# Patient Record
Sex: Male | Born: 2000 | Race: White | Hispanic: No | Marital: Single | Smoking: Never smoker
Health system: Southern US, Community
[De-identification: ages and names within clinical notes are randomized; demographics above are authoritative.]

## PROBLEM LIST (undated history)

## (undated) DIAGNOSIS — F909 Attention-deficit hyperactivity disorder, unspecified type: Secondary | ICD-10-CM

## (undated) HISTORY — DX: Attention-deficit hyperactivity disorder, unspecified type: F90.9

---
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
## 2018-01-19 ENCOUNTER — Ambulatory Visit (HOSPITAL_COMMUNITY): Payer: BC Managed Care – PPO

## 2018-01-26 ENCOUNTER — Encounter (HOSPITAL_COMMUNITY): Payer: Self-pay | Admitting: Radiology

## 2018-02-17 ENCOUNTER — Telehealth (HOSPITAL_COMMUNITY): Payer: Self-pay | Admitting: Family Medicine

## 2018-02-17 NOTE — Telephone Encounter (Signed)
01/18/18 Patient cancelled echo due to insurance. 01/26/18 number has been disconnected or no longer inservice. Will send a letter EVD 02/08/18 [id number] # has been disconnected [facility]  He will be removed from the workqueue.

## 2018-04-26 ENCOUNTER — Encounter: Payer: Self-pay | Admitting: Neurology

## 2018-06-30 ENCOUNTER — Other Ambulatory Visit: Payer: Self-pay

## 2018-06-30 ENCOUNTER — Encounter: Payer: Self-pay | Admitting: Neurology

## 2018-06-30 ENCOUNTER — Ambulatory Visit: Payer: BC Managed Care – PPO | Admitting: Neurology

## 2018-06-30 ENCOUNTER — Encounter

## 2018-06-30 VITALS — BP 104/60 | HR 98 | Ht 72.0 in | Wt 136.0 lb

## 2018-06-30 DIAGNOSIS — R410 Disorientation, unspecified: Secondary | ICD-10-CM | POA: Diagnosis not present

## 2018-06-30 NOTE — Progress Notes (Signed)
NEUROLOGY CONSULTATION NOTE  Jeremy Noble MRN: [id number] DOB: Jul 29, 2001  Referring provider: Dr. Blair Heys Primary care provider: Dr. Blair Heys  Reason for consult:  Episodes of confusion  Dear Dr Manus Gunning:  Thank you for your kind referral of Jeremy Noble for consultation of the above symptoms. Although his history is well known to you, please allow me to reiterate it for the purpose of our medical record. The patient was accompanied to the clinic by his mother who also provides collateral information. Records and images were personally reviewed where available.  HISTORY OF PRESENT ILLNESS: This is a pleasant 17 year old right-handed man with a history of ADHD presenting for evaluation of transient episodes of confusion that started around May. He feels symptoms may have started prior to Ramadan (mid-May), but during Ramadan, he noticed recurrent daily episodes every 15 to 25 minutes where he would be talking to someone then he would not understand what they are saying or what is going on for 10 seconds. If he was talking, he knows what he is saying but cannot hear that he is saying that word. A few times he has asked someone to repeat themselves. His mother has not noticed any staring/unresponsive episodes. He feels that other people would not notice them. They would not affect driving. He initially thought the episodes were related to Vyvanse, he has been taking this for many years then stopped it briefly. When he restarted it, he noticed these episodes so he stopped the medication but continued to have them. He was fasting during Ramadan, but continued to have these symptoms past Ramadan until he noticed that they were not occurring anymore 2 weeks ago. They only occur in the daytime and do not wake him up from sleep. He denies any associated headache, dizziness, focal numbness/tingling/weakness, olfactory/gustatory hallucinations, rising epigastric sensation. He denies any gaps in  time. He denies any myoclonic jerks. His mother has noticed a few mild hand tremors, these do not affect writing or eating. He may have had some stress from school in April, but was on vacation from May until recently. He reports good sleep, no alcohol intake. His mother feels there has been some anxiety and has encouraged him to see a therapist which he declines. His sister has epilepsy. He had a normal birth and early development.  There is no history of febrile convulsions, CNS infections such as meningitis/encephalitis, significant traumatic brain injury, neurosurgical procedures. His father and paternal grandmother have tremors.   PAST MEDICAL HISTORY: Past Medical History:  Diagnosis Date  . ADHD     PAST SURGICAL HISTORY: History reviewed. No pertinent surgical history.  MEDICATIONS:  Outpatient Encounter Medications as of 06/30/2018  Medication Sig  . VYVANSE 20 MG capsule TK 1 C PO QD IN THE MORNING   No facility-administered encounter medications on file as of 06/30/2018.    ALLERGIES: No Known Allergies  FAMILY HISTORY: Family History  Problem Relation Age of Onset  . Seizures Sister     SOCIAL HISTORY: Social History   Socioeconomic History  . Marital status: Single    Spouse name: Not on file  . Number of children: Not on file  . Years of education: Not on file  . Highest education level: Not on file  Occupational History  . Not on file  Social Needs  . Financial resource strain: Not on file  . Food insecurity:    Worry: Not on file    Inability: Not on file  . Transportation  needs:    Medical: Not on file    Non-medical: Not on file  Tobacco Use  . Smoking status: Not on file  Substance and Sexual Activity  . Alcohol use: Not on file  . Drug use: Not on file  . Sexual activity: Not on file  Lifestyle  . Physical activity:    Days per week: Not on file    Minutes per session: Not on file  . Stress: Not on file  Relationships  . Social connections:     Talks on phone: Not on file    Gets together: Not on file    Attends religious service: Not on file    Active member of club or organization: Not on file    Attends meetings of clubs or organizations: Not on file    Relationship status: Not on file  . Intimate partner violence:    Fear of current or ex partner: Not on file    Emotionally abused: Not on file    Physically abused: Not on file    Forced sexual activity: Not on file  Other Topics Concern  . Not on file  Social History Narrative  . Not on file    REVIEW OF SYSTEMS: Constitutional: No fevers, chills, or sweats, no generalized fatigue, change in appetite Eyes: No visual changes, double vision, eye pain Ear, nose and throat: No hearing loss, ear pain, nasal congestion, sore throat Cardiovascular: No chest pain, palpitations Respiratory:  No shortness of breath at rest or with exertion, wheezes GastrointestinaI: No nausea, vomiting, diarrhea, abdominal pain, fecal incontinence Genitourinary:  No dysuria, urinary retention or frequency Musculoskeletal:  No neck pain, back pain Integumentary: No rash, pruritus, skin lesions Neurological: as above Psychiatric: No depression, insomnia, anxiety Endocrine: No palpitations, fatigue, diaphoresis, mood swings, change in appetite, change in weight, increased thirst Hematologic/Lymphatic:  No anemia, purpura, petechiae. Allergic/Immunologic: no itchy/runny eyes, nasal congestion, recent allergic reactions, rashes  PHYSICAL EXAM: Vitals:   06/30/18 0905  BP: 104/60  Pulse: 98  SpO2: 98%   General: No acute distress Head:  Normocephalic/atraumatic Eyes: Fundoscopic exam shows bilateral sharp discs, no vessel changes, exudates, or hemorrhages Neck: supple, no paraspinal tenderness, full range of motion Back: No paraspinal tenderness Heart: regular rate and rhythm Lungs: Clear to auscultation bilaterally. Vascular: No carotid bruits. Skin/Extremities: No rash, no  edema Neurological Exam: Mental status: alert and oriented to person, place, and time, no dysarthria or aphasia, Fund of knowledge is appropriate.  Recent and remote memory are intact. 3/3 delayed recall.  Attention and concentration are normal.    Able to name objects and repeat phrases. Cranial nerves: CN I: not tested CN II: pupils equal, round and reactive to light, visual fields intact, fundi unremarkable. CN III, IV, VI:  full range of motion, no nystagmus, no ptosis CN V: facial sensation intact CN VII: upper and lower face symmetric CN VIII: hearing intact to finger rub CN IX, X: gag intact, uvula midline CN XI: sternocleidomastoid and trapezius muscles intact CN XII: tongue midline Bulk & Tone: normal, no fasciculations. Motor: 5/5 throughout with no pronator drift. Sensation: intact to light touch, cold, pin, vibration and joint position sense.  No extinction to double simultaneous stimulation.  Romberg test negative Deep Tendon Reflexes: +2 throughout, no ankle clonus Plantar responses: downgoing bilaterally Cerebellar: no incoordination on finger to nose, heel to shin. No dysdiadochokinesia Gait: narrow-based and steady, able to tandem walk adequately. Tremor: none  IMPRESSION: This is a pleasant 17 year old  right-handed man with a history of ADHD, presenting for evaluation of a 3 month history of new onset recurrent episodes of transient confusion that were occurring multiple times a day. He has not noticed them in the past 2 weeks. Etiology of symptoms is unclear, his sister has primary generalized epilepsy, seizures are a consideration. We also discussed complicated migraines (although he denies headaches), anxiety, can potentially cause similar symptoms. MRI brain with and without contrast and a 1-hour sleep-deprived EEG will be ordered to assess for focal abnormalities that increase risk for recurrent seizures. We discussed that if tests are normal and symptoms recur, a  48-hour EEG will be done to classify his symptoms. His mother feels he is anxious, he was encouraged to see a school counselor. [place] driving laws were discussed with the patient, and he knows to stop driving after an episode of loss of awareness/consciousness, until 6 months event-free. He will follow-up in 4 months and knows to call for any changes.   Thank you for allowing me to participate in the care of this patient. Please do not hesitate to call for any questions or concerns.   Patrcia Dolly, M.D.  CC: Dr. Manus Gunning

## 2018-06-30 NOTE — Patient Instructions (Addendum)
1. Schedule MRI brain with and without contrast  We have sent a referral to [facility] for your MRI and they will call you directly to schedule your appt. They are located at [address] [facility]. If you need to contact them directly please call [phone].   2. Schedule 1-hour sleep-deprived EEG 3. Our office will call you with results, if normal, continue to monitor symptoms and call if any changes. If symptoms recur, we will plan to do a 48-hour EEG 4. Follow-up in 3-4 months, call for any changes

## 2018-07-06 ENCOUNTER — Telehealth: Payer: Self-pay

## 2018-07-06 ENCOUNTER — Ambulatory Visit (INDEPENDENT_AMBULATORY_CARE_PROVIDER_SITE_OTHER): Payer: BC Managed Care – PPO | Admitting: Neurology

## 2018-07-06 DIAGNOSIS — R410 Disorientation, unspecified: Secondary | ICD-10-CM | POA: Diagnosis not present

## 2018-07-06 NOTE — Procedures (Signed)
ELECTROENCEPHALOGRAM REPORT  Date of Study: 07/06/2018  Patient's Name: Jeremy Noble MRN: [id number] Date of Birth: [birth year]  Referring Provider: Dr. Patrcia Dolly  Clinical History: This is a 17 year old man with a 50-month history of recurrent episodes of transient confusion occurring multiple times a day  Medications: Vyvanse  Technical Summary: A multichannel digital 1-hour sleep-deprived EEG recording measured by the international 10-20 system with electrodes applied with paste and impedances below 5000 ohms performed in our laboratory with EKG monitoring in an awake and asleep patient.  Hyperventilation and photic stimulation were performed.  The digital EEG was referentially recorded, reformatted, and digitally filtered in a variety of bipolar and referential montages for optimal display.    Description: The patient is awake and asleep during the recording.  During maximal wakefulness, there is a symmetric, medium voltage 10 Hz posterior dominant rhythm that attenuates with eye opening.  The record is symmetric.  During drowsiness and sleep, there is an increase in theta slowing of the background.  Vertex waves and symmetric sleep spindles were seen.  Hyperventilation and photic stimulation did not elicit any abnormalities.  There were no epileptiform discharges or electrographic seizures seen.    EKG lead was unremarkable.  Impression: This 1-hour awake and asleep EEG is normal.    Clinical Correlation: A normal EEG does not exclude a clinical diagnosis of epilepsy.  If further clinical questions remain, prolonged EEG may be helpful.  Clinical correlation is advised.   Patrcia Dolly, M.D.

## 2018-07-06 NOTE — Telephone Encounter (Signed)
-----   Message from Van Clines, MD sent at 07/06/2018  2:00 PM EDT ----- Pls let him/mother know the EEG was normal, proceed with MRI brain. If symptoms recur, we will plan to do a 48-hour EEG. Thanks

## 2018-07-06 NOTE — Telephone Encounter (Signed)
Call pt.  No answer.  Phone rang 10 times and disconnected.  Unable to relay message below.

## 2018-10-13 ENCOUNTER — Encounter: Payer: Self-pay | Admitting: Neurology

## 2018-11-02 ENCOUNTER — Other Ambulatory Visit: Payer: Self-pay

## 2018-11-02 ENCOUNTER — Encounter

## 2018-11-02 ENCOUNTER — Ambulatory Visit: Payer: BC Managed Care – PPO | Admitting: Neurology

## 2018-11-02 ENCOUNTER — Encounter: Payer: Self-pay | Admitting: Neurology

## 2018-11-02 VITALS — BP 100/66 | HR 92 | Ht 72.0 in | Wt 131.0 lb

## 2018-11-02 DIAGNOSIS — R410 Disorientation, unspecified: Secondary | ICD-10-CM

## 2018-11-02 NOTE — Patient Instructions (Addendum)
1. Proceed with MRI brain with and without contrast 2. Schedule 48-hour EEG 3. Follow-up after tests, call for any changes  We have sent a referral to [facility] for your MRI and they will call you directly to schedule your appt. They are located at [address] [facility]. If you need to contact them directly please call [phone].

## 2018-11-02 NOTE — Progress Notes (Signed)
NEUROLOGY FOLLOW UP OFFICE NOTE  Jeremy Noble [id number] [birth year]  HISTORY OF PRESENT ILLNESS: I had the pleasure of seeing Jeremy Noble in follow-up in the neurology clinic on 11/02/2018.  The patient was last seen 4 months ago for recurrent episodes of transient confusion. He is accompanied by his father who helps supplement the history today.  Records and images were personally reviewed where available.  His 1-hour EEG was normal. He has not done MRI yet. Since his last visit, he reports that he had no symptoms while he was in school from September to mid-December while he was on Christmas vacation. Since then, he would have them a few times a day again on a daily basis, he feels they are not as often and not as strong, but with similar symptoms where he feels he would not understand what people are saying or what is going on for just 5 seconds. Family would not notice anything out of the ordinary, no staring/unresponsive episodes. He denies any other associated symptoms, no headache, dizziness, vision changes, nausea, olfactory/gustatory hallucinations, focal numbness/tingling/weakness, myoclonic jerks. He did not take Vyvanse for 3 days and did not notice any difference in symptoms. He reports memory and grades are good.   History on Initial Assessment 06/30/2018: This is a pleasant 18 year old right-handed man with a history of ADHD presenting for evaluation of transient episodes of confusion that started around May. He feels symptoms may have started prior to Ramadan (mid-May), but during Ramadan, he noticed recurrent daily episodes every 15 to 25 minutes where he would be talking to someone then he would not understand what they are saying or what is going on for 10 seconds. If he was talking, he knows what he is saying but cannot hear that he is saying that word. A few times he has asked someone to repeat themselves. His mother has not noticed any staring/unresponsive episodes. He feels that other  people would not notice them. They would not affect driving. He initially thought the episodes were related to Vyvanse, he has been taking this for many years then stopped it briefly. When he restarted it, he noticed these episodes so he stopped the medication but continued to have them. He was fasting during Ramadan, but continued to have these symptoms past Ramadan until he noticed that they were not occurring anymore 2 weeks ago. They only occur in the daytime and do not wake him up from sleep. He denies any associated headache, dizziness, focal numbness/tingling/weakness, olfactory/gustatory hallucinations, rising epigastric sensation. He denies any gaps in time. He denies any myoclonic jerks. His mother has noticed a few mild hand tremors, these do not affect writing or eating. He may have had some stress from school in April, but was on vacation from May until recently. He reports good sleep, no alcohol intake. His mother feels there has been some anxiety and has encouraged him to see a therapist which he declines. His sister has epilepsy. He had a normal birth and early development.  There is no history of febrile convulsions, CNS infections such as meningitis/encephalitis, significant traumatic brain injury, neurosurgical procedures. His father and paternal grandmother have tremors.   PAST MEDICAL HISTORY: Past Medical History:  Diagnosis Date  . ADHD     MEDICATIONS: Current Outpatient Medications on File Prior to Visit  Medication Sig Dispense Refill  . VYVANSE 20 MG capsule TK 1 C PO QD IN THE MORNING  0   No current facility-administered medications on file prior to  visit.     ALLERGIES: No Known Allergies  FAMILY HISTORY: Family History  Problem Relation Age of Onset  . Seizures Sister     SOCIAL HISTORY: Social History   Socioeconomic History  . Marital status: Single    Spouse name: Not on file  . Number of children: Not on file  . Years of education: Not on file  .  Highest education level: Not on file  Occupational History  . Not on file  Social Needs  . Financial resource strain: Not on file  . Food insecurity:    Worry: Not on file    Inability: Not on file  . Transportation needs:    Medical: Not on file    Non-medical: Not on file  Tobacco Use  . Smoking status: Never Smoker  . Smokeless tobacco: Never Used  Substance and Sexual Activity  . Alcohol use: Never    Frequency: Never  . Drug use: Never  . Sexual activity: Never  Lifestyle  . Physical activity:    Days per week: Not on file    Minutes per session: Not on file  . Stress: Not on file  Relationships  . Social connections:    Talks on phone: Not on file    Gets together: Not on file    Attends religious service: Not on file    Active member of club or organization: Not on file    Attends meetings of clubs or organizations: Not on file    Relationship status: Not on file  . Intimate partner violence:    Fear of current or ex partner: Not on file    Emotionally abused: Not on file    Physically abused: Not on file    Forced sexual activity: Not on file  Other Topics Concern  . Not on file  Social History Narrative   Pt lives in 2 story home with his parents and sister   Currently in his second year at [employer]   Works part-time as Licensed conveyancergrocery clerk for [employer]    REVIEW OF SYSTEMS: Constitutional: No fevers, chills, or sweats, no generalized fatigue, change in appetite Eyes: No visual changes, double vision, eye pain Ear, nose and throat: No hearing loss, ear pain, nasal congestion, sore throat Cardiovascular: No chest pain, palpitations Respiratory:  No shortness of breath at rest or with exertion, wheezes GastrointestinaI: No nausea, vomiting, diarrhea, abdominal pain, fecal incontinence Genitourinary:  No dysuria, urinary retention or frequency Musculoskeletal:  No neck pain, back pain Integumentary: No rash, pruritus, skin lesions Neurological: as  above Psychiatric: No depression, insomnia, anxiety Endocrine: No palpitations, fatigue, diaphoresis, mood swings, change in appetite, change in weight, increased thirst Hematologic/Lymphatic:  No anemia, purpura, petechiae. Allergic/Immunologic: no itchy/runny eyes, nasal congestion, recent allergic reactions, rashes  PHYSICAL EXAM: Vitals:   11/02/18 0943  BP: 100/66  Pulse: 92  SpO2: 98%   General: No acute distress Head:  Normocephalic/atraumatic Neck: supple, no paraspinal tenderness, full range of motion Heart:  Regular rate and rhythm Lungs:  Clear to auscultation bilaterally Back: No paraspinal tenderness Skin/Extremities: No rash, no edema Neurological Exam: alert and oriented to person, place, and time. No aphasia or dysarthria. Fund of knowledge is appropriate.  Recent and remote memory are intact.  Attention and concentration are normal.    Able to name objects and repeat phrases. Cranial nerves: Pupils equal, round, reactive to light. Extraocular movements intact with no nystagmus. Visual fields full. Facial sensation intact. No facial asymmetry. Tongue, uvula, palate  midline.  Motor: Bulk and tone normal, muscle strength 5/5 throughout with no pronator drift.  Sensation to light touch, temperature and vibration intact.  No extinction to double simultaneous stimulation. Finger to nose testing intact.  Gait narrow-based and steady, able to tandem walk adequately.  Romberg negative.  IMPRESSION: This is a pleasant 18 yo RH man with a history of ADHD, with recurrent episodes of transient confusion occurring multiple times a day. Symptoms quieted down for 3 months, then recurred mid-December. His 1-hour EEG was normal, however typical events were not captured. Etiology of symptoms remains unclear, seizures is considered, his sister has seizures, however there may be a psychological component as well. Proceed with planned MRI brain with and without contrast to assess for underlying  structural abnormality. A 48-hour EEG will be done to further classify his symptoms. He is aware of [place] driving laws to stop driving after an episode of loss of awareness/consciousness, until 6 months event-free. He will follow-up after the tests, they know to call for any changes.   Thank you for allowing me to participate in his care.  Please do not hesitate to call for any questions or concerns.  The duration of this appointment visit was 20 minutes of face-to-face time with the patient.  Greater than 50% of this time was spent in counseling, explanation of diagnosis, planning of further management, and coordination of care.   Patrcia DollyKaren Sonya Pucci, M.D.   CC: Dr. Manus GunningEhinger

## 2018-11-03 ENCOUNTER — Telehealth: Payer: Self-pay | Admitting: *Deleted

## 2018-11-03 NOTE — Telephone Encounter (Signed)
LMOM to call [phone] to set up 48 hr ambulatory EEG. We do these on Monday mornings and remove them on Wednesday mornings.

## 2018-11-07 ENCOUNTER — Telehealth: Payer: Self-pay | Admitting: Neurology

## 2018-11-07 NOTE — Telephone Encounter (Signed)
Patient is calling in to let yall know he is going to get the MRI done first and wait for the results before he scheduled his EEG. Just FYI. Thanks!

## 2018-11-15 ENCOUNTER — Ambulatory Visit
Admission: RE | Admit: 2018-11-15 | Discharge: 2018-11-15 | Disposition: A | Discharge status: Home / Self Care | Payer: BC Managed Care – PPO | Source: Ambulatory Visit | Attending: Neurology | Admitting: Neurology

## 2018-11-15 DIAGNOSIS — R410 Disorientation, unspecified: Secondary | ICD-10-CM

## 2018-11-15 MED ORDER — GADOBENATE DIMEGLUMINE 529 MG/ML IV SOLN
12.0000 mL | Freq: Once | INTRAVENOUS | Status: AC | PRN
  Administered 2018-11-15: 12 mL via INTRAVENOUS

## 2018-11-22 ENCOUNTER — Telehealth: Payer: Self-pay

## 2018-11-22 NOTE — Telephone Encounter (Signed)
Called pt.  No answer.  LMOM asking for return call to relay results below.  

## 2018-11-22 NOTE — Telephone Encounter (Signed)
-----   Message from Van Clines, MD sent at 11/16/2018  9:03 AM EST ----- Pls let him know the MRI brain was normal, no evidence of tumor, stroke, or bleed. He was waiting for MRI results before proceeding with ambulatory EEG, would proceed with the test, pls let Darl Pikes know to schedule. Thanks

## 2018-12-16 ENCOUNTER — Telehealth: Payer: Self-pay | Admitting: Neurology

## 2018-12-16 NOTE — Telephone Encounter (Signed)
Patient called regarding his results. Please Call. Thanks

## 2018-12-16 NOTE — Telephone Encounter (Signed)
Returned call to pt.  Relayed normal MRI results as well as Dr. Rosalyn Gess recommendation that pt have Ambulatory EEG done.  Pt states that he would like some time to think about proceeding with EEG and states that he will call our office when he is ready to schedule.  I did advise that neither the front desk nor myself schedule the ambulatory EEG's but that he would need to leave message with the front desk for Darl Pikes to schedule.

## 2018-12-19 ENCOUNTER — Telehealth: Payer: Self-pay | Admitting: *Deleted

## 2018-12-19 NOTE — Telephone Encounter (Signed)
Returned call from message he left on Friday the 21st at 4:32 pm stating he wants to schedule the ambulatory EEG.  I offered the first available on March 9th he said he wants to think about it. I informed him of other dates and times so when he calls with his calendar in hand he will have an idea of when there may be availability. I told him first come first serve so those dates have no guarantees. He wanted 12/26/2018 but it is full so he said he will think about it and decide if he wants to proceed or not.

## 2018-12-23 NOTE — Telephone Encounter (Signed)
I called and left . He returned the call and is set up for that date.a message that March 2nd is now available

## 2018-12-26 ENCOUNTER — Ambulatory Visit (INDEPENDENT_AMBULATORY_CARE_PROVIDER_SITE_OTHER): Payer: BC Managed Care – PPO | Admitting: Neurology

## 2018-12-26 DIAGNOSIS — R410 Disorientation, unspecified: Secondary | ICD-10-CM | POA: Diagnosis not present

## 2019-01-16 NOTE — Procedures (Signed)
ELECTROENCEPHALOGRAM REPORT  Dates of Recording: 12/26/2018 8:08AM to 12/28/2018 8:12AM  Patient's Name: Jeremy Noble MRN: [id number] Date of Birth: 2001-03-13  Referring Provider: Dr. Patrcia Dolly  Procedure: 48-hour ambulatory EEG  History: This is a 18 year old man with recurrent episodes of transient confusion occurring multiple times a day.  Medications: Vyvanse  Technical Summary: This is a 48-hour multichannel digital video EEG recording measured by the international 10-20 system with electrodes applied with paste and impedances below 5000 ohms performed as portable with EKG monitoring.  The digital EEG was referentially recorded, reformatted, and digitally filtered in a variety of bipolar and referential montages for optimal display.    DESCRIPTION OF RECORDING: During maximal wakefulness, the background activity consisted of a symmetric 10 Hz posterior dominant rhythm which was reactive to eye opening.  There were no epileptiform discharges or focal slowing seen in wakefulness.  During the recording, the patient progresses through wakefulness, drowsiness, and Stage 2 sleep.  Again, there were no epileptiform discharges seen.  Events: On 03/02 at 1230hours, he pushes the button for suddenly blanking out while watching TV. He is lying on bed with no clinical changes seen. Electrographically, there were no EEG or EKG changes seen.  On 03/02 at 1829 hours, he vomits a small amount. Patient had been sick during the study. Electrographically, there were no EEG changes seen. EKG showed sinus tachycardia up at 138 bpm.  On 03/02 at 2011 hours, he pushes the button after vomiting a small amount. Electrographically, there were no EEG changes seen. EKG showed sinus tachycardia up to 120bpm.  On 03/02 at 2334 hours, he pushes event button for vomiting. Electrographically, there were no EEG or EKG changes seen.  There were no electrographic seizures seen.  EKG lead was  unremarkable.  IMPRESSION: This 48-hour ambulatory EEG study is normal.    CLINICAL CORRELATION: A normal EEG does not exclude a clinical diagnosis of epilepsy. There was one typical episode of blanking out with no EEG changes seen. Baseline EEG normal. Patient was unwell during study with vomiting, no EEG changes during these push button events.  If further clinical questions remain, inpatient video EEG monitoring may be helpful.   Patrcia Dolly, M.D.

## 2019-02-13 ENCOUNTER — Telehealth: Payer: Self-pay | Admitting: General Surgery

## 2019-02-13 NOTE — Telephone Encounter (Signed)
-----   Message from Van Clines, MD sent at 01/16/2019 11:30 AM EDT ----- Pls let patient/parents know that his EEG was normal, no evidence of epilepsy. Thanks

## 2019-02-13 NOTE — Telephone Encounter (Signed)
LVM for Crue to contact the office regarding his EEG.

## 2019-02-14 NOTE — Telephone Encounter (Signed)
Patient called and I relayed the info below. Thanks!

## 2019-05-26 IMAGING — MR MR HEAD WO/W CM
12 series · 48 of 48 positions shown · IV contrast (11ml multihance)
Comparison: None.

CLINICAL DATA: 17 y/o M; episodes of transient confusion over 6-7
months.

EXAM:
MRI HEAD WITHOUT AND WITH CONTRAST
TECHNIQUE: Multiplanar, multiecho pulse sequences of the brain and surrounding
structures were obtained without and with intravenous contrast.
CONTRAST:  12mL MULTIHANCE GADOBENATE DIMEGLUMINE 529 MG/ML IV SOLN

[Series 2: T1 · sagittal · 5.0mm · 0.45mm/px · 1 of 23 slices shown]
[im 1/23]
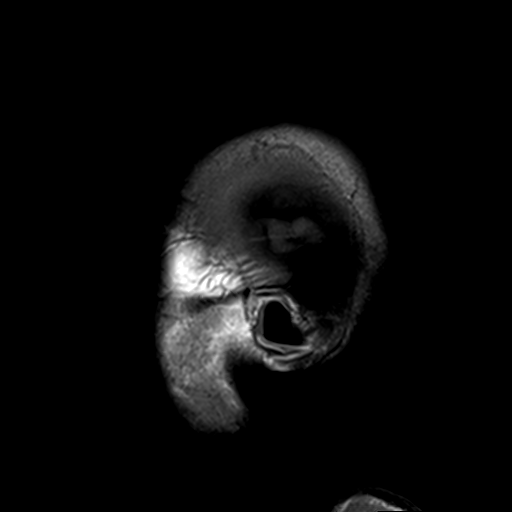

[Series 3: DWI · axial · 3.0mm · 1.80mm/px · z∈[-6,+134]mm · 7 of 96 slices shown (1 of 4)]
[im 1/96]
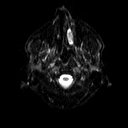
[im 16/96]
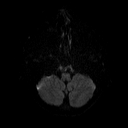
[im 32/96]
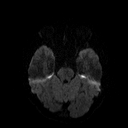
[im 48/96]
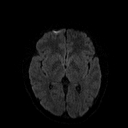
[im 64/96]
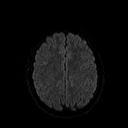
[im 80/96]
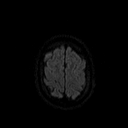
[im 96/96]
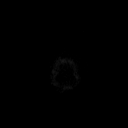

[Series 4: DWI · axial · 3.0mm · 1.80mm/px · z∈[-6,+134]mm · 3 of 48 slices shown (2 of 4)]
[im 1/48]
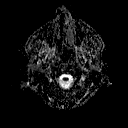
[im 24/48]
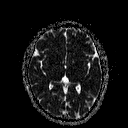
[im 48/48]
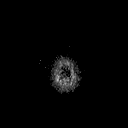

[Series 5: DWI · coronal · 5.0mm · 1.80mm/px · 5 of 66 slices shown (3 of 4)]
[im 1/66]
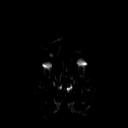
[im 17/66]
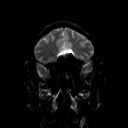
[im 33/66]
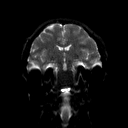
[im 49/66]
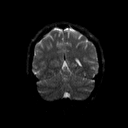
[im 66/66]
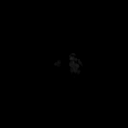

[Series 6: DWI · coronal · 5.0mm · 1.80mm/px · 2 of 33 slices shown (4 of 4)]
[im 1/33]
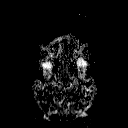
[im 33/33]
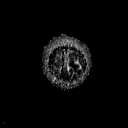

[Series 7: T2 · axial · 5.0mm · 0.51mm/px · z∈[-3,+138]mm · 2 of 22 slices shown (1 of 2)]
[im 1/22]
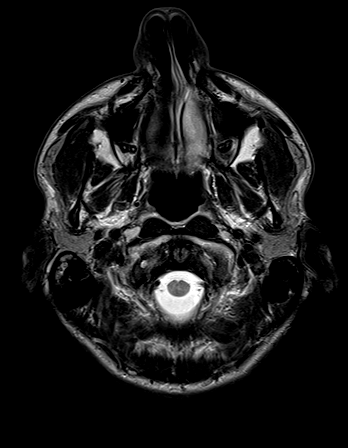
[im 22/22]
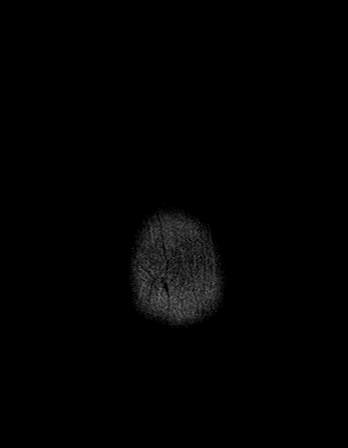

[Series 8: FLAIR · axial · 3.0mm · 0.45mm/px · z∈[-7,+135]mm · 2 of 32 slices shown]
[im 1/32]
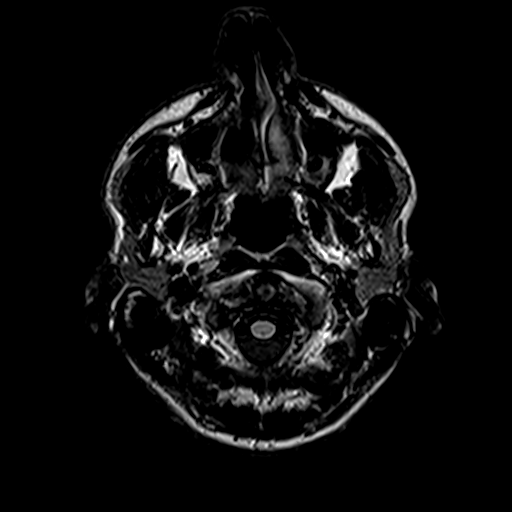
[im 32/32]
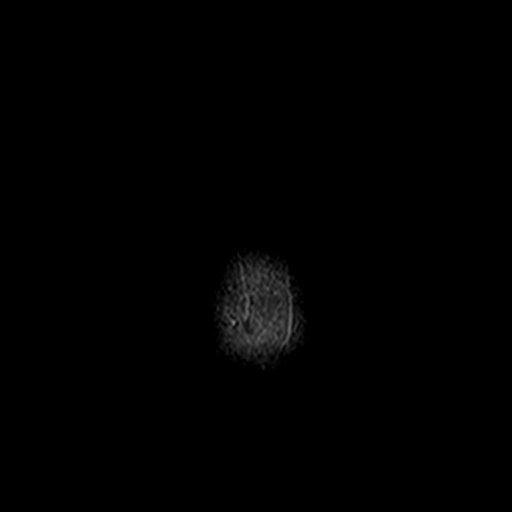

[Series 10: swi_images · axial · 4.0mm · 0.90mm/px · z∈[-4,+134]mm · 2 of 36 slices shown]
[im 1/36]
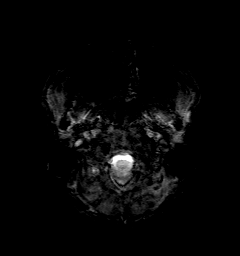
[im 36/36]
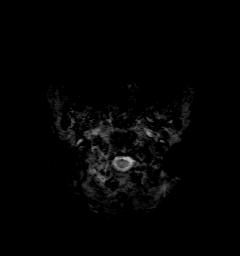

[Series 11: t1_mpr_tra · axial · 1.0mm · 0.75mm/px · z∈[-7,+141]mm · 10 of 144 slices shown (1 of 2)]
[im 1/144]
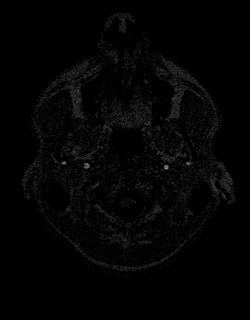
[im 16/144]
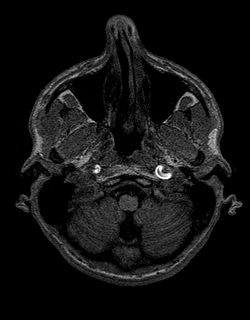
[im 32/144]
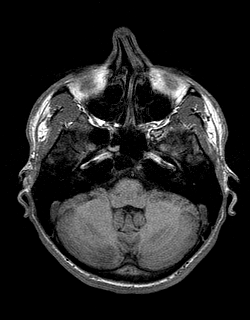
[im 48/144]
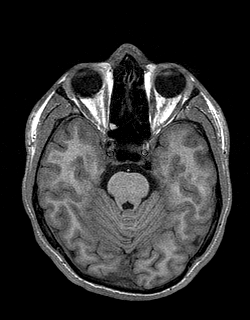
[im 64/144]
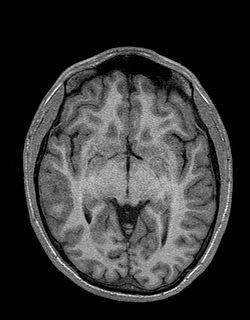
[im 80/144]
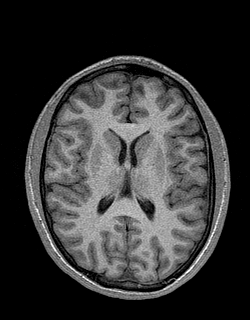
[im 96/144]
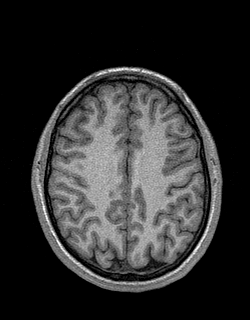
[im 112/144]
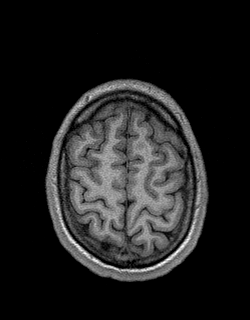
[im 128/144]
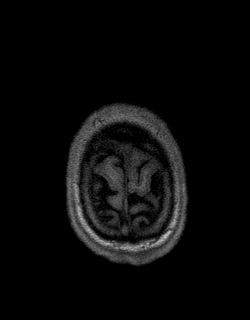
[im 144/144]
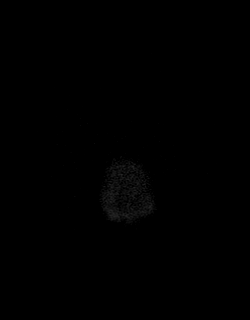

[Series 12: T2 · coronal · 5.0mm · 0.45mm/px · 2 of 27 slices shown (2 of 2)]
[im 1/27]
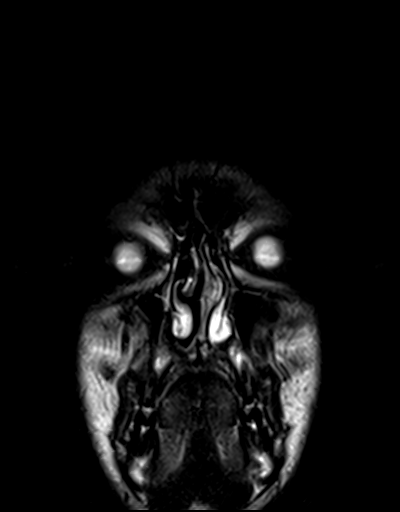
[im 27/27]
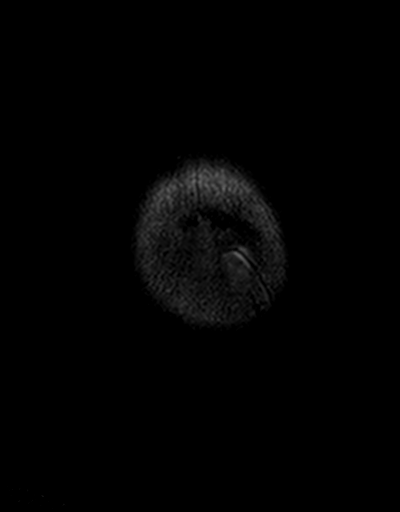

[Series 13: t1_mpr_tra · axial · 1.0mm · 0.75mm/px · z∈[-7,+141]mm · 10 of 144 slices shown (2 of 2)]
[im 1/144]
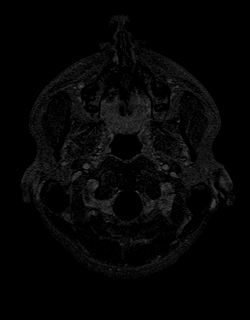
[im 16/144]
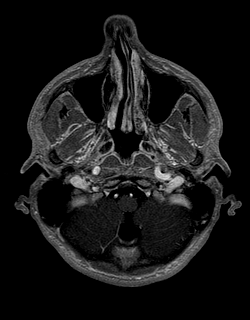
[im 32/144]
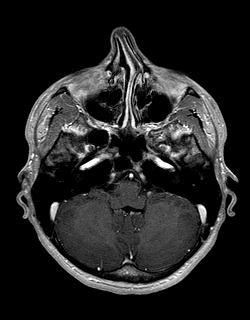
[im 48/144]
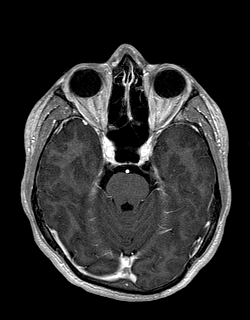
[im 64/144]
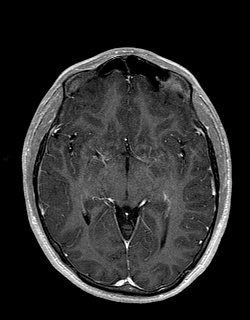
[im 80/144]
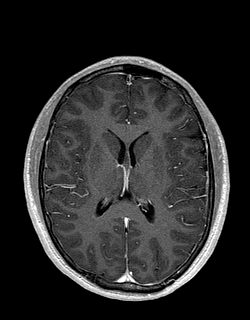
[im 96/144]
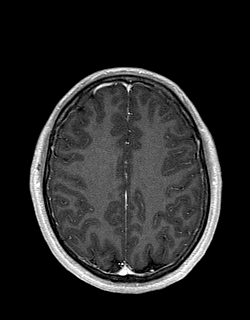
[im 112/144]
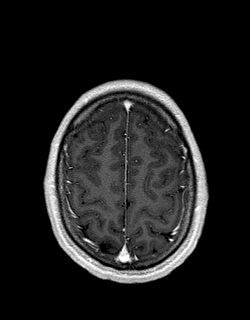
[im 128/144]
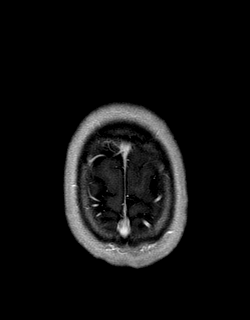
[im 144/144]
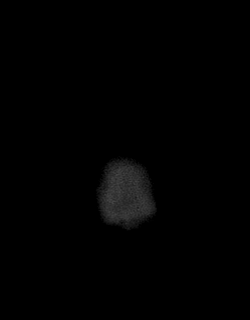

[Series 14: post cor · coronal · 5.0mm · 0.45mm/px · 2 of 27 slices shown]
[im 1/27]
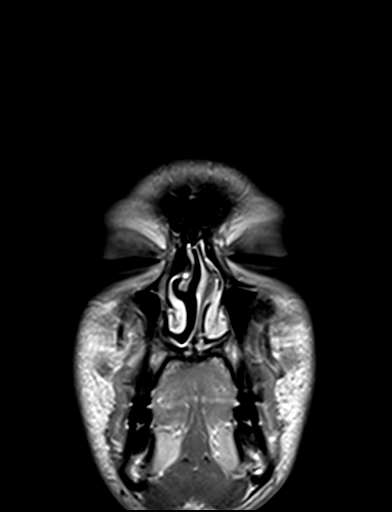
[im 27/27]
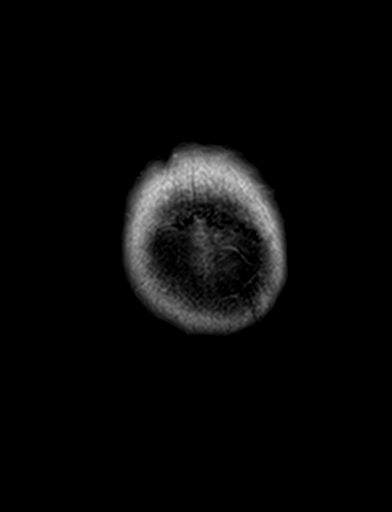

[48 of 48 positions shown; findings below may reference images not displayed]

FINDINGS: Brain: No acute infarction, hemorrhage, hydrocephalus, extra-axial
collection or mass lesion. Complete corpus callosum and vermis. No
disorder cortical formation, heterotopia, or cortical dysplasia.
Hippocampi are symmetric in size and signal bilaterally. No
structural or signal abnormality of the brain identified. After
administration of intravenous contrast there is no abnormal
enhancement.

Vascular: Normal flow voids.

Skull and upper cervical spine: Normal marrow signal.

Sinuses/Orbits: Mild ethmoid and sphenoid sinus mucosal thickening.
No abnormal signal of mastoid air cells. Orbits are unremarkable.

Other: None.
IMPRESSION: No structural cause of seizure identified. Unremarkable MRI of the
brain.

## 2019-06-21 ENCOUNTER — Ambulatory Visit (INDEPENDENT_AMBULATORY_CARE_PROVIDER_SITE_OTHER): Payer: BC Managed Care – PPO | Admitting: Psychology

## 2019-06-21 DIAGNOSIS — F4321 Adjustment disorder with depressed mood: Secondary | ICD-10-CM

## 2019-06-21 DIAGNOSIS — F900 Attention-deficit hyperactivity disorder, predominantly inattentive type: Secondary | ICD-10-CM

## 2019-07-11 ENCOUNTER — Ambulatory Visit: Payer: BC Managed Care – PPO | Admitting: Psychology

## 2019-07-13 ENCOUNTER — Ambulatory Visit: Payer: BC Managed Care – PPO | Admitting: Psychology

## 2019-07-27 ENCOUNTER — Ambulatory Visit: Payer: BC Managed Care – PPO | Admitting: Psychology

## 2019-09-04 DIAGNOSIS — F322 Major depressive disorder, single episode, severe without psychotic features: Secondary | ICD-10-CM

## 2019-09-04 DIAGNOSIS — F840 Autistic disorder: Secondary | ICD-10-CM

## 2019-09-04 DIAGNOSIS — F900 Attention-deficit hyperactivity disorder, predominantly inattentive type: Secondary | ICD-10-CM

## 2020-10-09 ENCOUNTER — Other Ambulatory Visit: Payer: Self-pay | Admitting: Physician Assistant

## 2020-10-09 ENCOUNTER — Ambulatory Visit
Admission: RE | Admit: 2020-10-09 | Discharge: 2020-10-09 | Disposition: A | Discharge status: Home / Self Care | Payer: BC Managed Care – PPO | Source: Ambulatory Visit | Attending: Physician Assistant | Admitting: Physician Assistant

## 2020-10-09 DIAGNOSIS — R52 Pain, unspecified: Secondary | ICD-10-CM

## 2021-04-19 IMAGING — CR DG FOOT COMPLETE 3+V*R*
3 series · 3 of 3 positions shown · non-contrast
Comparison: None.

CLINICAL DATA: Right lateral foot pain

EXAM:
RIGHT FOOT COMPLETE - 3+ VIEW

[t foot ap right]
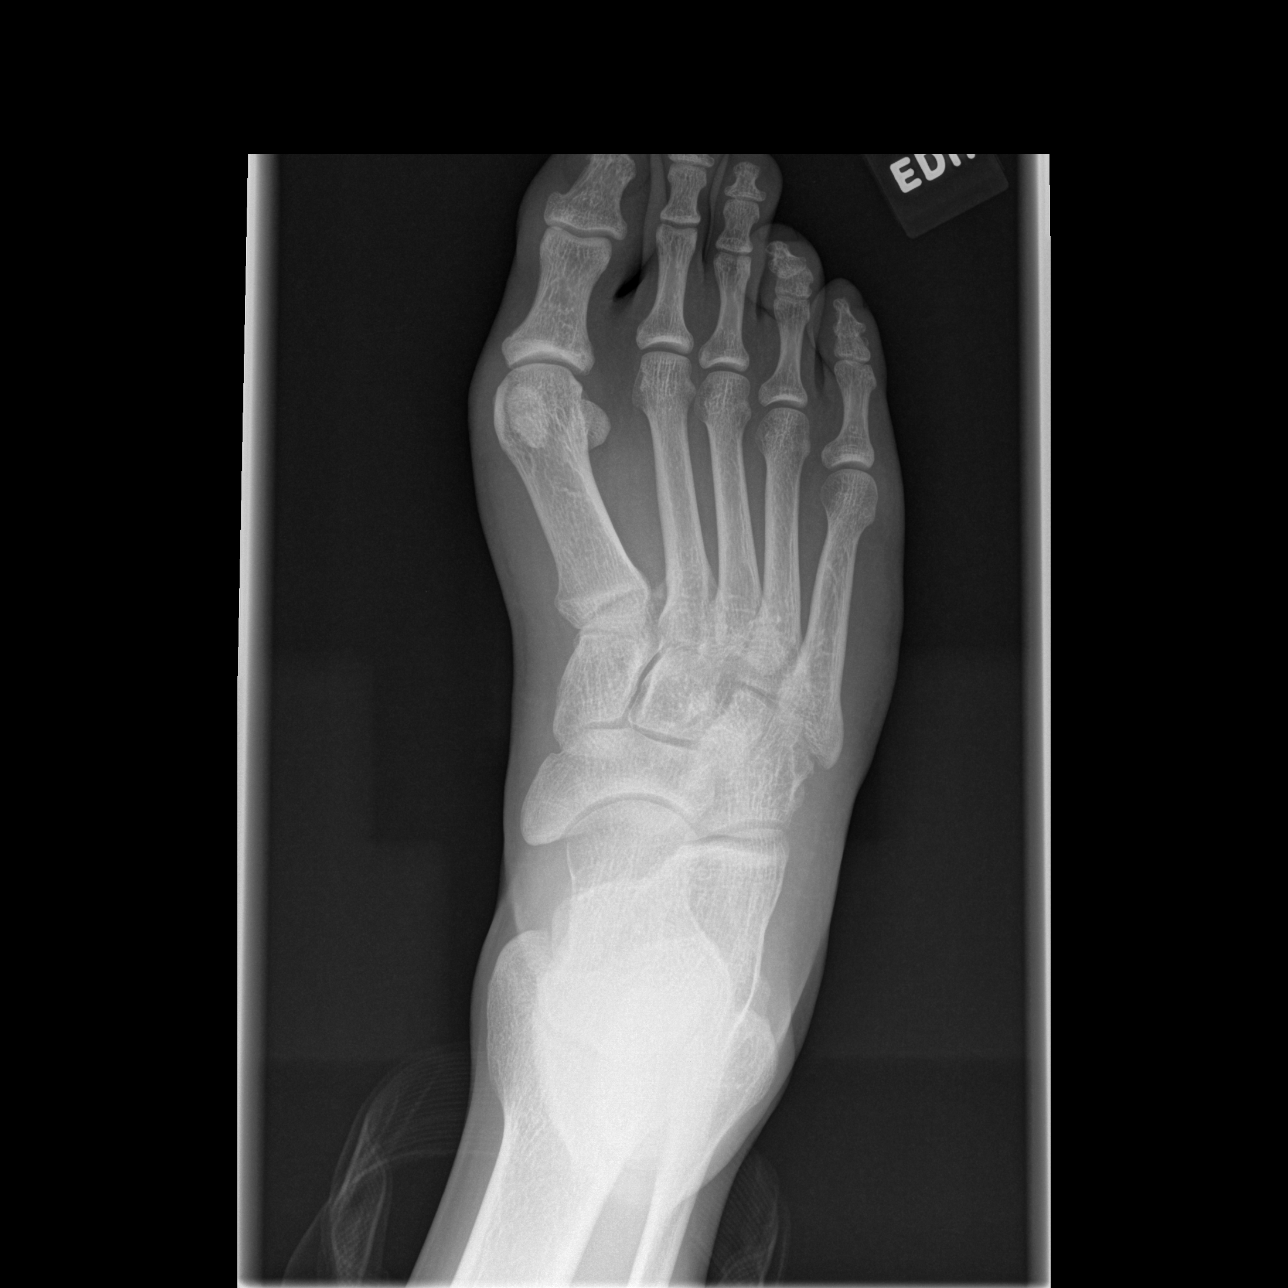

[t foot oblique right]
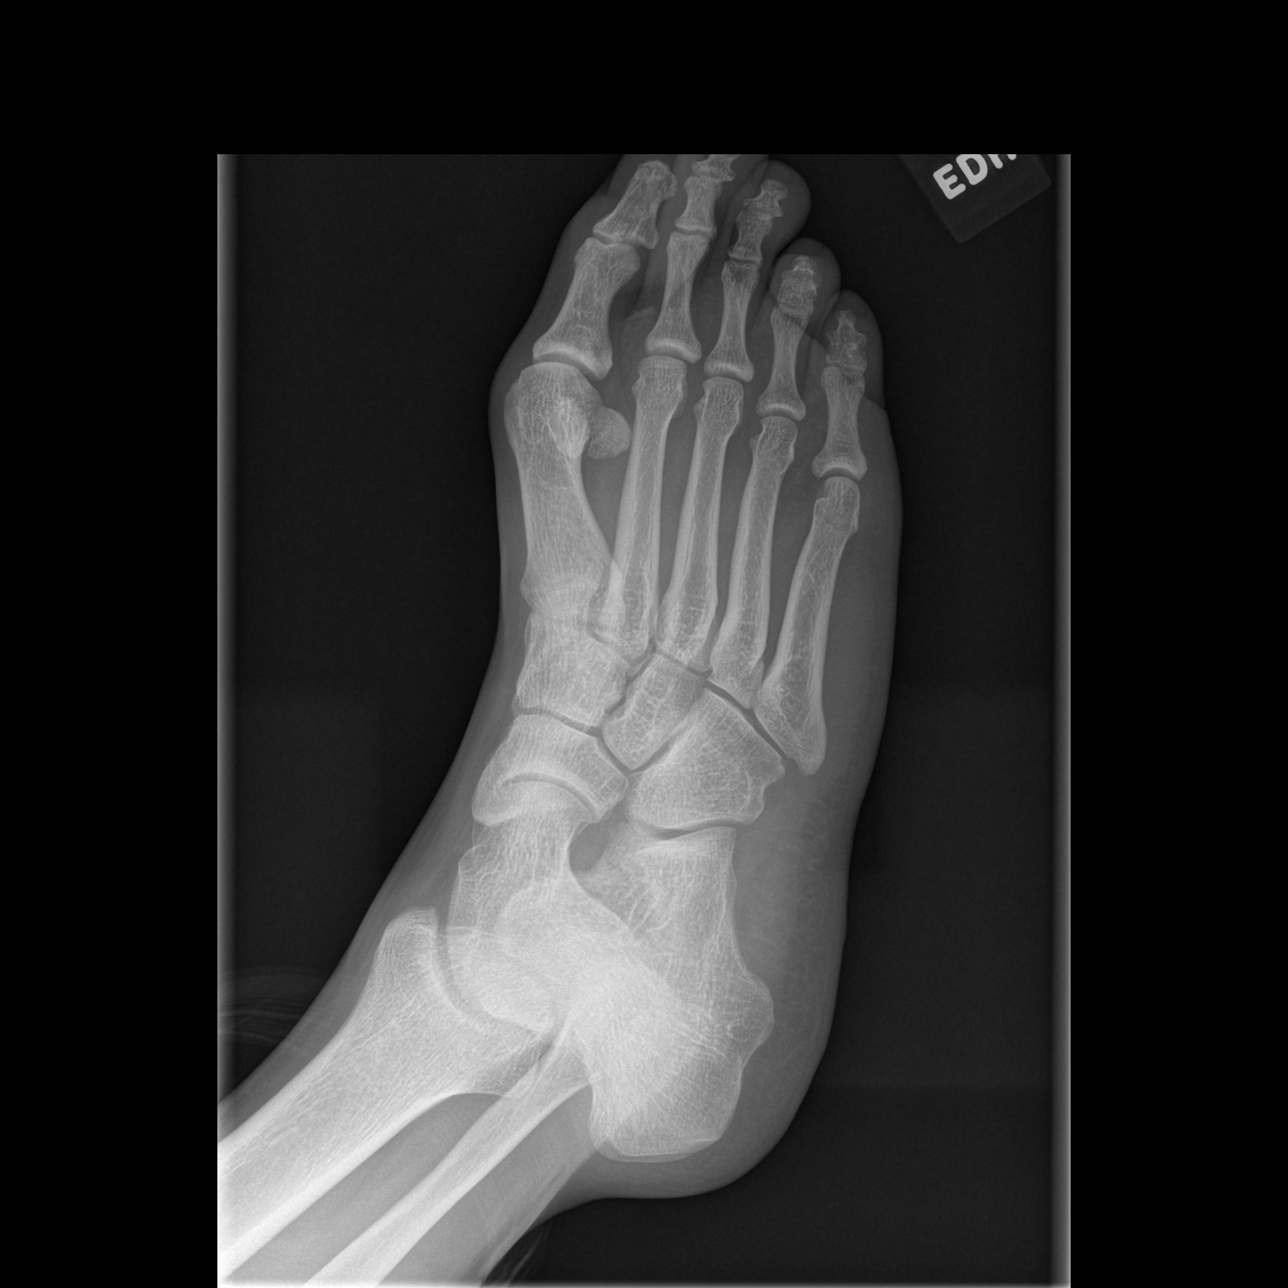

[t foot lat right]
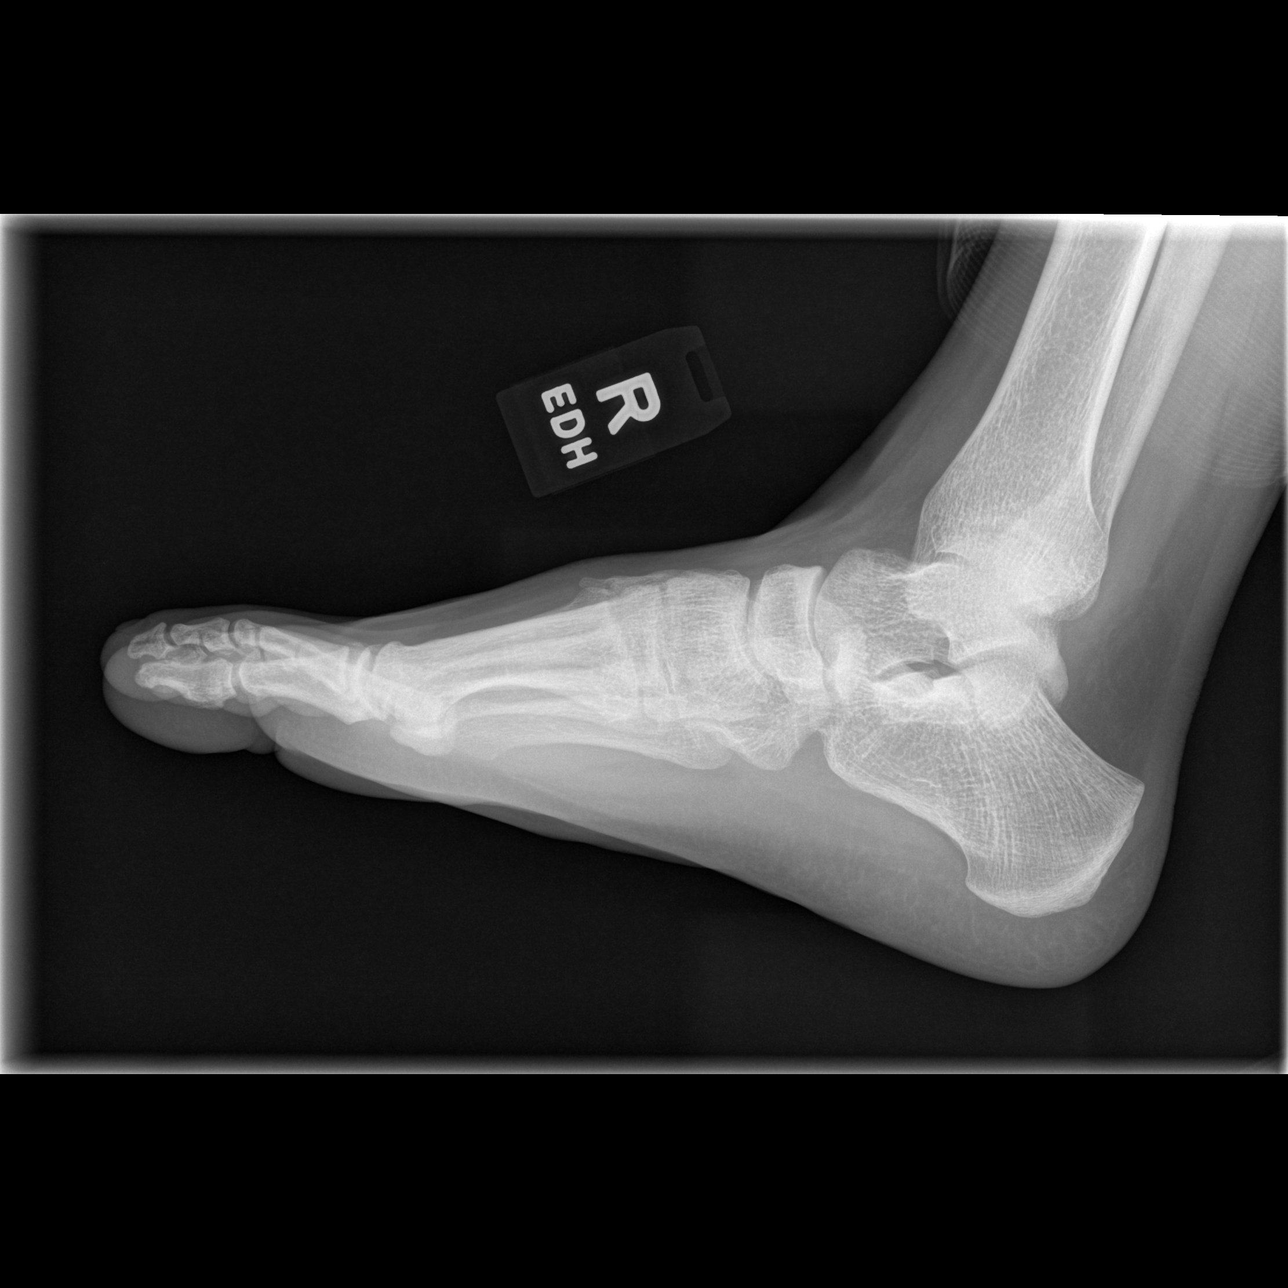

[3 of 3 positions shown; findings below may reference images not displayed]

FINDINGS: No fracture or malalignment. Mild degenerative changes at the first
MTP joint with hallux valgus deformity.
IMPRESSION: No acute osseous abnormality.

## 2021-11-06 ENCOUNTER — Ambulatory Visit (INDEPENDENT_AMBULATORY_CARE_PROVIDER_SITE_OTHER): Payer: BC Managed Care – PPO

## 2021-11-06 ENCOUNTER — Other Ambulatory Visit: Payer: Self-pay

## 2021-11-06 ENCOUNTER — Ambulatory Visit: Payer: BC Managed Care – PPO | Admitting: Podiatry

## 2021-11-06 DIAGNOSIS — M79672 Pain in left foot: Secondary | ICD-10-CM

## 2021-11-06 DIAGNOSIS — M21612 Bunion of left foot: Secondary | ICD-10-CM

## 2021-11-06 DIAGNOSIS — M79671 Pain in right foot: Secondary | ICD-10-CM

## 2021-11-06 DIAGNOSIS — M21611 Bunion of right foot: Secondary | ICD-10-CM | POA: Diagnosis not present

## 2021-11-06 NOTE — Patient Instructions (Signed)

## 2021-11-11 NOTE — Progress Notes (Signed)
Subjective:   Patient ID: Jeremy Noble, male   DOB: 21 y.o.   MRN: [id number]   HPI 21 year old male presents the office today for concerns of his big toes moving inwards to the second toe.  He is not sure why this is happening wants to have it checked.  The right has been worse than left.  Not causing any pain.  No recent injuries that he reports.  No swelling.  No other concerns.   Review of Systems  All other systems reviewed and are negative.  Past Medical History:  Diagnosis Date   ADHD     No past surgical history on file.   Current Outpatient Medications:    VYVANSE 20 MG capsule, TK 1 C PO QD IN THE MORNING, Disp: , Rfl: 0  No Known Allergies        Objective:  Physical Exam  General: AAO x3, NAD  Dermatological: Skin is warm, dry and supple bilateral.  There are no open sores, no preulcerative lesions, no rash or signs of infection present.  Vascular: Dorsalis Pedis artery and Posterior Tibial artery pedal pulses are 2/4 bilateral with immedate capillary fill time.  There is no pain with calf compression, swelling, warmth, erythema.   Neruologic: Grossly intact via light touch bilateral.   Musculoskeletal: Mild hallux abductus is present.  There is no pain or crepitation.  No tenderness on exam.  No hypermobility of first ray.  Flexor, extensor tendons appear to be intact. MMT 5/5.   Gait: Unassisted, Nonantalgic.       Assessment:   Asymptomatic bunions bilaterally     Plan:  -Treatment options discussed including all alternatives, risks, and complications -Etiology of symptoms were discussed -Patient did not want to get x-rays today. -We discussed bunions as well as natural progression of bunions.  Discussed shoes and offloading.  Discussed wearing good arch supports.  If it becomes symptomatic let us know or there is any changes.  Trula Slade DPM

## 2021-11-24 ENCOUNTER — Other Ambulatory Visit: Payer: Self-pay | Admitting: Podiatry

## 2022-12-08 ENCOUNTER — Ambulatory Visit (INDEPENDENT_AMBULATORY_CARE_PROVIDER_SITE_OTHER): Payer: BC Managed Care – PPO | Admitting: Clinical

## 2022-12-08 DIAGNOSIS — F909 Attention-deficit hyperactivity disorder, unspecified type: Secondary | ICD-10-CM | POA: Diagnosis not present

## 2022-12-08 DIAGNOSIS — F4320 Adjustment disorder, unspecified: Secondary | ICD-10-CM | POA: Diagnosis not present

## 2022-12-08 NOTE — Progress Notes (Signed)
Time: 9:00am-9:57am CPT Code: [id number] Diagnosis: Adjustment Disorder NOS, ADHD combined  Katrina was seen in person for an initial intake session. Following verbal review of consent forms, therapist engaged him in an intake and worked with him to devise a treatment plan for therapy. Thurl provided input and verbal consent to all treatment plan goals and interventions. He is scheduled to be seen again in two weeks.  Intake Zakari presented for therapy seeking consultation for how to improve various situations in his life. He shared that he has struggled to find a job with a typical 9-5 schedule, and would like assistance finding a job that is Monday-Friday. 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 He shared that he currently works 36 hours per week, but 7 days per week. He has been working at [employer] since June of 2021, and at the elementary school for one month. 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 He lives with his parents at present and graduated high school in 2018. He attended [employer] and graduated in 2022 with a degree in media.  Presenting Problem Andrzej is feeling unfulfilled in his current life circumstances and unsure how to change them.  Symptoms fatigue, difficulty with time management  Social Support: Avanish shared that he has been talking to a potential significant other who is in a similar life situation. He also described himself as very close with his coworkers at [employer]. He regularly goes to play pool with them on Fridays, as well as other times during the week.   Family of origin: He shared that he gets along with his parents "for the most part," and has an older sister who is in a similar situation to him. She is 65 and also lives with their parents. He shared that they get along fairly well as a family.   Problems in family of origin: None noted  Risk Assessment: suicidal ideation and intent denied at  intake, history of suicidal ideation and intent denied  Diagnoses: ADHD diagnosed when Barry was in elementary school.   Medical: Yuto takes a low dose of Vyvanse as needed, and usually takes this several days per week, but not on weekends. He reported no other medical issues.   Substance use: occasional social drinking  No needy reported when aske General Behavior: cooperative  Attire: appropriate  Gait: Not normal  Motor Activity: normal  Stream of Thought - Productivity:   Stream of thought - Progression: normal  Stream of thought - Language: normal  Emotional tone and reactions - Mood: normal  Emotional tone and reactions - Affect: appropriate  Mental trend/Content of thoughts - Perception: normal  Mental trend/Content of thoughts - Orientation: normal  Mental trend/Content of thoughts - Memory: normal  Mental trend/Content of thoughts - General knowledge: consistent with education  Insight: good  Judgment: good    Diagnostic Summary  Adjustment Disorder NOS, ADHD  Treatment Plan Client Abilities/Strengths  Hazael has clarity about why he is pursuing therapy and what he'd like to focus on.   Client Treatment Preferences: Bow prefers in person visits in the morning before 12pm.  Client Statement of Needs  Dmitriy is seeking guidance for how to better align his life circumstances with his goals and preferences.  Treatment Level Once or twice per month    Symptoms  Frequently tired, difficulty with time management  Problems Addressed  Deontea is having trouble finding a job, as well as  figuring out where to look for the kind of job he is seeking.  Goals 1. Antone would like to connect with additional resources to help him find a job that allows him to live independently  Objective: Saurabh will apply for jobs and set up job interviews regularly  Target Date: 12/09/2023 Frequency: Biweekly/Monthly  Progress: 0 Modality: Individual therapy  Related Interventions Therapist will help  Freedom to notice and disengage from maladaptive thoughts and behaviors using CBT-based strategies Harlie will have opportunities to process experiences in session Therapist will incorporate strategies from acceptance and commitment therapy, including values-based decision making Breyson will have the opportunity to use session time to work on topics related to his job search as Youth worker will provide Terris with referrals for additional resources as appropriate                Myrtie Cruise, PhD

## 2022-12-22 ENCOUNTER — Ambulatory Visit: Payer: BC Managed Care – PPO | Admitting: Clinical

## 2022-12-22 DIAGNOSIS — F4323 Adjustment disorder with mixed anxiety and depressed mood: Secondary | ICD-10-CM

## 2022-12-22 NOTE — Progress Notes (Signed)
Time: 9:00am-9:57am CPT Code: [id number] Diagnosis: Adjustment Disorder NOS, ADHD combined  Jeremy Noble was seen in person for therapy. He had completed his homework of following up with a career connection, and had plans that evening to participate in a media project. Session focused on continuing to better understand his career goals and to begin to identify concrete steps he can take toward these goals. He created a plan to talk to his employer to see if he can leave slightly early in order to attend a monthly professional meeting. He is scheduled to be seen again in two weeks.  Treatment Plan Client Abilities/Strengths  Jeremy Noble has clarity about why he is pursuing therapy and what he'd like to focus on.   Client Treatment Preferences: Jeremy Noble prefers in person visits in the morning before 12pm.  Client Statement of Needs  Jeremy Noble is seeking guidance for how to better align his life circumstances with his goals and preferences.  Treatment Level Once or twice per month    Symptoms  Frequently tired, difficulty with time management  Problems Addressed  Jeremy Noble is having trouble finding a job, as well as figuring out where to look for the kind of job he is seeking.  Goals 1. Jeremy Noble would like to connect with additional resources to help him find a job that allows him to live independently  Objective: Jeremy Noble will apply for jobs and set up job interviews regularly  Target Date: 12/09/2023 Frequency: Biweekly/Monthly  Progress: 0 Modality: Individual therapy  Related Interventions Therapist will help Jeremy Noble to notice and disengage from maladaptive thoughts and behaviors using CBT-based strategies Jeremy Noble will have opportunities to process experiences in session Therapist will incorporate strategies from acceptance and commitment therapy, including values-based decision making Jeremy Noble will have the opportunity to use session time to work on topics related to his job search as Jeremy Noble will provide Jeremy Noble with  referrals for additional resources as appropriate     Myrtie Cruise, PhD               Myrtie Cruise, PhD

## 2023-01-13 ENCOUNTER — Ambulatory Visit (INDEPENDENT_AMBULATORY_CARE_PROVIDER_SITE_OTHER): Payer: BC Managed Care – PPO | Admitting: Clinical

## 2023-01-13 DIAGNOSIS — F4320 Adjustment disorder, unspecified: Secondary | ICD-10-CM

## 2023-01-13 DIAGNOSIS — F902 Attention-deficit hyperactivity disorder, combined type: Secondary | ICD-10-CM

## 2023-01-13 NOTE — Progress Notes (Signed)
Time: 9:00am-9:57am CPT Code: [id number] Diagnosis: Adjustment Disorder NOS, ADHD combined  Jeremy Noble was seen in person for therapy. Session focused on reviewing events since his last appointment, which included a job opportunity that had fallen through. 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 He is scheduled to be seen again in one month.  Treatment Plan Client Abilities/Strengths  Jeremy Noble has clarity about why he is pursuing therapy and what he'd like to focus on.   Client Treatment Preferences: Jeremy Noble prefers in person visits in the morning before 12pm.  Client Statement of Needs  Jeremy Noble is seeking guidance for how to better align his life circumstances with his goals and preferences.  Treatment Level Once or twice per month    Symptoms  Frequently tired, difficulty with time management  Problems Addressed  Jeremy Noble is having trouble finding a job, as well as figuring out where to look for the kind of job he is seeking.  Goals 1. Jeremy Noble would like to connect with additional resources to help him find a job that allows him to live independently  Objective: Jeremy Noble will apply for jobs and set up job interviews regularly  Target Date: 12/09/2023 Frequency: Biweekly/Monthly  Progress: 0 Modality: Individual therapy  Related Interventions Therapist will help Jeremy Noble to notice and disengage from maladaptive thoughts and behaviors using CBT-based strategies Jeremy Noble will have opportunities to process experiences in session Therapist will incorporate strategies from acceptance and commitment therapy, including values-based decision making Jeremy Noble will have the opportunity to use session time to work on topics related to his job search as Jeremy Noble will provide Rea with referrals for additional resources as appropriate     Myrtie Cruise,  PhD               Myrtie Cruise, PhD

## 2023-02-10 ENCOUNTER — Ambulatory Visit: Payer: BC Managed Care – PPO | Admitting: Clinical

## 2023-03-10 ENCOUNTER — Ambulatory Visit (INDEPENDENT_AMBULATORY_CARE_PROVIDER_SITE_OTHER): Payer: BC Managed Care – PPO | Admitting: Clinical

## 2023-03-10 DIAGNOSIS — F4323 Adjustment disorder with mixed anxiety and depressed mood: Secondary | ICD-10-CM

## 2023-03-10 NOTE — Progress Notes (Signed)
Time: 9:00am-9:57am CPT Code: [id number] Diagnosis: Adjustment Disorder NOS, ADHD combined  Jeremy Noble was seen in person for therapy. 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 Therapist offered reframing of several cognitions, and encouraged him to look for meaning and value in this moment. Therapist also encouraged him to create a plan (for example, applying for jobs for one hour per day), and then allow himself to focus on other interests outside of that plan. He is scheduled to be seen again in one month.  Treatment Plan Client Abilities/Strengths  Jeremy Noble has clarity about why he is pursuing therapy and what he'd like to focus on.   Client Treatment Preferences: [name] prefers in person visits in the morning before 12pm.  Client Statement of Needs  Jeremy Noble is seeking guidance for how to better align his life circumstances with his goals and preferences.  Treatment Level Once or twice per month    Symptoms  Frequently tired, difficulty with time management  Problems Addressed  Jeremy Noble is having trouble finding a job, as well as figuring out where to look for the kind of job he is seeking.  Goals 1. Jeremy Noble would like to connect with additional resources to help him find a job that allows him to live independently  Objective: Jeremy Noble will apply for jobs and set up job interviews regularly  Target Date: 12/09/2023 Frequency: Biweekly/Monthly  Progress: 0 Modality: Individual therapy  Related Interventions Therapist will help Jeremy Noble to notice and disengage from maladaptive thoughts and behaviors using CBT-based strategies Jeremy Noble will have opportunities to process experiences in session Therapist will incorporate strategies from acceptance and commitment therapy, including values-based decision making Jeremy Noble will have the opportunity to use session time to work on topics related to his job search as Sport and exercise psychologist will provide Jeremy Noble with referrals for additional resources as appropriate     Chrissie Noa, PhD               Chrissie Noa, PhD

## 2023-04-21 ENCOUNTER — Ambulatory Visit: Payer: BC Managed Care – PPO | Admitting: Clinical

## 2023-10-08 DIAGNOSIS — F909 Attention-deficit hyperactivity disorder, unspecified type: Secondary | ICD-10-CM | POA: Diagnosis not present

## 2023-10-28 DIAGNOSIS — F909 Attention-deficit hyperactivity disorder, unspecified type: Secondary | ICD-10-CM | POA: Diagnosis not present

## 2023-11-16 DIAGNOSIS — R4582 Worries: Secondary | ICD-10-CM | POA: Diagnosis not present

## 2023-12-01 DIAGNOSIS — F909 Attention-deficit hyperactivity disorder, unspecified type: Secondary | ICD-10-CM | POA: Diagnosis not present

## 2023-12-09 DIAGNOSIS — F4321 Adjustment disorder with depressed mood: Secondary | ICD-10-CM | POA: Diagnosis not present

## 2023-12-22 DIAGNOSIS — F4321 Adjustment disorder with depressed mood: Secondary | ICD-10-CM | POA: Diagnosis not present

## 2024-01-05 DIAGNOSIS — F4321 Adjustment disorder with depressed mood: Secondary | ICD-10-CM | POA: Diagnosis not present

## 2024-01-11 DIAGNOSIS — F909 Attention-deficit hyperactivity disorder, unspecified type: Secondary | ICD-10-CM | POA: Diagnosis not present

## 2024-01-19 DIAGNOSIS — F4321 Adjustment disorder with depressed mood: Secondary | ICD-10-CM | POA: Diagnosis not present

## 2024-02-16 DIAGNOSIS — N529 Male erectile dysfunction, unspecified: Secondary | ICD-10-CM | POA: Diagnosis not present

## 2024-02-16 DIAGNOSIS — F909 Attention-deficit hyperactivity disorder, unspecified type: Secondary | ICD-10-CM | POA: Diagnosis not present

## 2024-02-16 DIAGNOSIS — M25551 Pain in right hip: Secondary | ICD-10-CM | POA: Diagnosis not present

## 2024-03-08 DIAGNOSIS — F4321 Adjustment disorder with depressed mood: Secondary | ICD-10-CM | POA: Diagnosis not present

## 2024-03-16 DIAGNOSIS — N529 Male erectile dysfunction, unspecified: Secondary | ICD-10-CM | POA: Diagnosis not present

## 2024-03-16 DIAGNOSIS — F909 Attention-deficit hyperactivity disorder, unspecified type: Secondary | ICD-10-CM | POA: Diagnosis not present

## 2024-06-16 DIAGNOSIS — F909 Attention-deficit hyperactivity disorder, unspecified type: Secondary | ICD-10-CM | POA: Diagnosis not present

## 2024-06-16 DIAGNOSIS — N529 Male erectile dysfunction, unspecified: Secondary | ICD-10-CM | POA: Diagnosis not present

## 2024-06-28 DIAGNOSIS — J020 Streptococcal pharyngitis: Secondary | ICD-10-CM | POA: Diagnosis not present

## 2024-06-28 DIAGNOSIS — L731 Pseudofolliculitis barbae: Secondary | ICD-10-CM | POA: Diagnosis not present

## 2024-06-28 DIAGNOSIS — J029 Acute pharyngitis, unspecified: Secondary | ICD-10-CM | POA: Diagnosis not present

## 2024-09-25 DIAGNOSIS — F909 Attention-deficit hyperactivity disorder, unspecified type: Secondary | ICD-10-CM | POA: Diagnosis not present

## 2024-09-30 ENCOUNTER — Other Ambulatory Visit (HOSPITAL_BASED_OUTPATIENT_CLINIC_OR_DEPARTMENT_OTHER): Payer: Self-pay
